# Patient Record
Sex: Male | Born: 1995 | Race: White | Hispanic: No | Marital: Single | Smoking: Current every day smoker
Health system: Southern US, Community
[De-identification: ages and names within clinical notes are randomized; demographics above are authoritative.]

---
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
## 2015-08-28 ENCOUNTER — Ambulatory Visit
Admission: EM | Admit: 2015-08-28 | Discharge: 2015-08-28 | Disposition: A | Discharge status: Home / Self Care | Payer: BLUE CROSS/BLUE SHIELD | Attending: Emergency Medicine | Admitting: Emergency Medicine

## 2015-08-28 DIAGNOSIS — Z0000 Encounter for general adult medical examination without abnormal findings: Secondary | ICD-10-CM

## 2015-08-28 DIAGNOSIS — Z113 Encounter for screening for infections with a predominantly sexual mode of transmission: Secondary | ICD-10-CM

## 2015-08-28 NOTE — Discharge Instructions (Signed)
Take the medication as written. Give us a working phone number so that we can contact you if needed. Refrain from sexual contact until you know your results and your partner(s) are treated if necessary. Follow-up with a primary care physician of your choice. See the list on your discharge papers.   Go to www.goodrx.com to look up your medications. This will give you a list of where you can find your prescriptions at the most affordable prices.

## 2015-08-28 NOTE — ED Provider Notes (Signed)
HPI  SUBJECTIVE:  Jeffrey Norris is a 20 y.o. male who presents for an STD check. He is asymptomatic. He denies testicular pain or swelling, penile discharge, genital rash, itching, abdominal pain, fevers, urinary symptoms. He is in a monogamous sexual relationship with a male, who is asymptomatic. They do not use condoms. STDs are not a concern today, he is just requesting screening. There are no aggravating or alleviating factors. He has not tried anything for this. Past medical history negative for gonorrhea, chlamydia, herpes, HIV, syphilis, Trichomonas, yeast, diabetes. PMD: None.  History reviewed. No pertinent past medical history.  History reviewed. No pertinent past surgical history.  History reviewed. No pertinent family history.  Social History  Substance Use Topics  . Smoking status: Current Every Day Smoker -- 0.50 packs/day    Types: Cigarettes  . Smokeless tobacco: None  . Alcohol Use: Yes     Comment: occ    No current facility-administered medications for this encounter. No current outpatient prescriptions on file.  No Known Allergies   ROS  As noted in HPI.   Physical Exam  BP 121/56 mmHg  Pulse 70  Temp(Src) 97.5 F (36.4 C) (Oral)  Resp 16  SpO2 100%  Constitutional: Well developed, well nourished, no acute distress Eyes:  EOMI, conjunctiva normal bilaterally HENT: Normocephalic, atraumatic,mucus membranes moist Respiratory: Normal inspiratory effort Cardiovascular: Normal rate GI: nondistended soft, nontender. No suprapubic tenderness  back: No CVA tenderness GU: Normal circumcised male, testes nontender descended equal bilaterally. No swelling, erythema. No tenderness over the epididymis. No penile rash, discharge. Patient declined chaperone.  Lymph: No inguinal lymphadenopathy. skin: No rash, skin intact Musculoskeletal: no deformities Neurologic: Alert & oriented x 3, no focal neuro deficits Psychiatric: Speech and behavior  appropriate   ED Course   Medications - No data to display  Orders Placed This Encounter  Procedures  . Chlamydia/NGC rt PCR    Standing Status: Standing     Number of Occurrences: 1     Standing Expiration Date:   . Urinalysis complete, with microscopic    Standing Status: Standing     Number of Occurrences: 1     Standing Expiration Date:   . HIV antibody    Standing Status: Standing     Number of Occurrences: 1     Standing Expiration Date:   . RPR    Standing Status: Standing     Number of Occurrences: 1     Standing Expiration Date:     No results found for this or any previous visit (from the past 24 hour(s)). No results found.    ED Clinical Impression  Screening for STD (sexually transmitted disease)  Normal physical exam   ED Assessment/Plan  Urine negative for trichomoniasis per lab.   Patient is asymptomatic. We'll not treat today. Sent off gonorrhea, chlamydia. Doing urine micro to rule out trichomonas. HIV, RPR pending. Advised that these results will be back in several days.. Advised pt to refrain from sexual contact until he knows lab results,  and partner(s) are treated if necessary. Pt provided working phone number. Pt agrees with plan.    *This clinic note was created using Dragon dictation software. Therefore, there may be occasional mistakes despite careful proofreading.  ?    Domenick GongAshley Christan Ciccarelli, MD 08/28/15 1505

## 2015-08-28 NOTE — ED Notes (Signed)
Here STD check.  SO wanted pt to have check up.

## 2015-08-28 NOTE — ED Notes (Signed)
Non-toxic appearing pt.  Ambulatory to treatment room. In NAD.

## 2015-08-29 LAB — CHLAMYDIA/NGC RT PCR (ARMC ONLY)
CHLAMYDIA TR: NOT DETECTED
N gonorrhoeae: NOT DETECTED

## 2015-08-29 LAB — HIV ANTIBODY (ROUTINE TESTING W REFLEX): HIV SCREEN 4TH GENERATION: NONREACTIVE

## 2015-08-29 LAB — RPR: RPR: NONREACTIVE

## 2016-12-03 ENCOUNTER — Encounter: Payer: Self-pay | Admitting: *Deleted

## 2016-12-03 ENCOUNTER — Ambulatory Visit
Admission: EM | Admit: 2016-12-03 | Discharge: 2016-12-03 | Disposition: A | Discharge status: Home / Self Care | Payer: BLUE CROSS/BLUE SHIELD | Attending: Emergency Medicine | Admitting: Emergency Medicine

## 2016-12-03 DIAGNOSIS — R21 Rash and other nonspecific skin eruption: Secondary | ICD-10-CM

## 2016-12-03 DIAGNOSIS — Z113 Encounter for screening for infections with a predominantly sexual mode of transmission: Secondary | ICD-10-CM

## 2016-12-03 DIAGNOSIS — L739 Follicular disorder, unspecified: Secondary | ICD-10-CM | POA: Diagnosis not present

## 2016-12-03 DIAGNOSIS — N489 Disorder of penis, unspecified: Secondary | ICD-10-CM

## 2016-12-03 DIAGNOSIS — Z202 Contact with and (suspected) exposure to infections with a predominantly sexual mode of transmission: Secondary | ICD-10-CM | POA: Diagnosis not present

## 2016-12-03 DIAGNOSIS — N509 Disorder of male genital organs, unspecified: Secondary | ICD-10-CM

## 2016-12-03 LAB — CHLAMYDIA/NGC RT PCR (ARMC ONLY)
CHLAMYDIA TR: NOT DETECTED
N GONORRHOEAE: NOT DETECTED

## 2016-12-03 MED ORDER — MUPIROCIN 2 % EX OINT: TOPICAL_OINTMENT | CUTANEOUS | 0 refills | Status: DC

## 2016-12-03 NOTE — ED Triage Notes (Signed)
Rash to genitalia, x "several weeks". Requesting STD check.

## 2016-12-03 NOTE — Discharge Instructions (Signed)
Use medication as prescribed.  Avoid shaving or trimming hair at this time. Practice safe sex. No sexual activity until results, symptoms resolved and follow up.   Follow up with your primary care physician or Health Dept tin one week. Return to Urgent care for new or worsening concerns.

## 2016-12-03 NOTE — ED Provider Notes (Signed)
[facility] ____________________________________________  Time seen: Approximately 11:10 AM  I have reviewed the triage vital signs and the nursing notes.   HISTORY  Chief Complaint Rash and Exposure to STD   HPI Jeffrey Norris is a 21 y.o. male  presenting for evaluation of rash to the groin area that is been present for approximately 2 weeks. Patient reports he has a few bumps to the shaft of his penis that he noticed last week. States occasionally a slight itching, no pain or burning with associated rash. Denies any drainage. Patient reports otherwise feels well. Denies any urinary frequency, urinary urgency or burning with urination. Denies any mass, swelling, penile drainage, penile or testicular pain or tenderness. Denies any other rash or skin changes. Denies history of similar in past. Reports last sexual encounter was approximately 3 weeks ago with a new partner which he intermittently uses condoms. Patient does express some concern of STDs. Reports sexual partner was asymptomatic. Denies any previous history of STDs. Denies previous herpes or cold sores. Patient rate states the rash is not painful. Reports rash is not draining. States he has also noticed to see a few areas in the hair above his penis. Patient reports that he does shave and trim hair is growing frequently.  Denies chest pain, shortness of breath, abdominal pain, back pain, dysuria, extremity pain, extremity swelling. Denies recent sickness. Denies recent antibiotic use.    History reviewed. No pertinent past medical history. Denies  There are no active problems to display for this patient.   History reviewed. No pertinent surgical history.   No current facility-administered medications for this encounter.   Current Outpatient Prescriptions:  .  mupirocin ointment (BACTROBAN) 2 %, Apply three times a day for 7 days., Disp: 22 g, Rfl: 0  Allergies Patient has no known allergies.  History  reviewed. No pertinent family history.  Social History Social History  Substance Use Topics  . Smoking status: Current Every Day Smoker    Packs/day: 0.50    Types: Cigarettes  . Smokeless tobacco: Never Used  . Alcohol use Yes     Comment: occ    Review of Systems Constitutional: No fever/chills ENT: No sore throat. Cardiovascular: Denies chest pain. Respiratory: Denies shortness of breath. Gastrointestinal: No abdominal pain.   Genitourinary: Negative for dysuria. Musculoskeletal: Negative for back pain. Skin: as above.   ____________________________________________   PHYSICAL EXAM:  VITAL SIGNS: ED Triage Vitals  Enc Vitals Group     BP 12/03/16 1009 132/73     Pulse Rate 12/03/16 1009 85     Resp 12/03/16 1009 16     Temp 12/03/16 1009 97.9 F (36.6 C)     Temp Source 12/03/16 1009 Oral     SpO2 12/03/16 1009 100 %     Weight 12/03/16 1011 150 lb (68 kg)     Height 12/03/16 1011 5\' 11"  (1.803 m)     Head Circumference --      Peak Flow --      Pain Score --      Pain Loc --      Pain Edu? --      Excl. in GC? --     Constitutional: Alert and oriented. Well appearing and in no acute distress.      Mouth/Throat: Mucous membranes are moist.Oropharynx non-erythematous. Cardiovascular: Normal rate, regular rhythm. Grossly normal heart sounds.  Good peripheral circulation. Respiratory: Normal respiratory effort without tachypnea nor retractions. Breath sounds are clear and equal bilaterally.  No wheezes, rales, rhonchi. Gastrointestinal: Soft and nontender.  No CVA tenderness. Penile: Exam completed with Selena Batten RN at bedside a chaperone. No mass or bulge. No penile or testicular tenderness to palpation, no discharge, no erythema, no vesicular appearing rash, four less than 0.25 cm small pustular lesions with slight erythematous base present scattered to dorsal shaft of penis and 2-3 lesions present and suprapubic hair without surrounding erythema, nontender, no  drainage, nonfluctuant. No other skin abnormalities noted. Musculoskeletal:  No midline cervical, thoracic or lumbar tenderness to palpation. Neurologic:  Normal speech and language. Speech is normal. No gait instability.  Skin:  Skin is warm, dry Psychiatric: Mood and affect are normal. Speech and behavior are normal. Patient exhibits appropriate insight and judgment   ___________________________________________   LABS (all labs ordered are listed, but only abnormal results are displayed)  Labs Reviewed  CHLAMYDIA/NGC RT PCR ([facility] ONLY)  HSV CULTURE AND TYPING  HIV ANTIBODY (ROUTINE TESTING)  HSV(HERPES SIMPLEX VRS) I + II AB-IGG  HSV(HERPES SIMPLEX VRS) I + II AB-IGM  RPR  MISC [facility] TEST (SEND OUT)    PROCEDURES Procedures     INITIAL IMPRESSION / ASSESSMENT AND PLAN / ED COURSE  Pertinent labs & imaging results that were available during my care of the patient were reviewed by me and considered in my medical decision making (see chart for details).  Very well-appearing patient. No acute distress. Presenting for evaluation of bumps to penis. Clinical appearance appears to be similar to a very mild folliculitis, and discussed with patient as he does frequently shaving trim hair as to avoid this and to avoid skin irritation. Discussed with patient we will prescribe topical Bactroban. HSV swab obtained same area. Discussed with patient and patient requests testing for HSV, RPR, HIV, gonorrhea, chlamydia and trichomonas. Encourage safe sex practices. Encouraged supportive care. Discussed no sexual intercourse until testing received, symptoms resolved and patient had follow-up. Information also given for [facility].Discussed indication, risks and benefits of medications with patient.  Discussed follow up with Primary care physician this week. Discussed follow up and return parameters including no resolution or any worsening concerns. Patient verbalized  understanding and agreed to plan.   ____________________________________________   FINAL CLINICAL IMPRESSION(S) / ED DIAGNOSES  Final diagnoses:  Screening for STD (sexually transmitted disease)  Penile lesion  Folliculitis     Discharge Medication List as of 12/03/2016 10:28 AM    START taking these medications   Details  mupirocin ointment (BACTROBAN) 2 % Apply three times a day for 7 days., Normal        Note: This dictation was prepared with Dragon dictation along with smaller phrase technology. Any transcriptional errors that result from this process are unintentional.         Renford Dills, NP 12/03/16 1150

## 2016-12-04 LAB — HSV(HERPES SIMPLEX VRS) I + II AB-IGG
HSV 1 Glycoprotein G Ab, IgG: <0.91 index (ref 0.00–0.90)
HSV 2 Glycoprotein G Ab, IgG: <0.91 index (ref 0.00–0.90)

## 2016-12-04 LAB — MISC LABCORP TEST (SEND OUT): Labcorp test code: 188052

## 2016-12-04 LAB — HSV(HERPES SIMPLEX VRS) I + II AB-IGM: HSVI/II Comb IgM: <0.91 Ratio (ref 0.00–0.90)

## 2016-12-04 LAB — RPR: RPR: NONREACTIVE

## 2016-12-04 LAB — HIV ANTIBODY (ROUTINE TESTING W REFLEX): HIV SCREEN 4TH GENERATION: NONREACTIVE

## 2016-12-05 LAB — HSV CULTURE AND TYPING

## 2019-04-25 ENCOUNTER — Encounter: Payer: Self-pay | Admitting: Emergency Medicine

## 2019-04-25 ENCOUNTER — Other Ambulatory Visit: Payer: Self-pay

## 2019-04-25 ENCOUNTER — Ambulatory Visit (INDEPENDENT_AMBULATORY_CARE_PROVIDER_SITE_OTHER): Payer: BC Managed Care – PPO

## 2019-04-25 ENCOUNTER — Ambulatory Visit
Admission: EM | Admit: 2019-04-25 | Discharge: 2019-04-25 | Disposition: A | Discharge status: Home / Self Care | Payer: BC Managed Care – PPO | Attending: Emergency Medicine | Admitting: Emergency Medicine

## 2019-04-25 DIAGNOSIS — M79661 Pain in right lower leg: Secondary | ICD-10-CM | POA: Diagnosis not present

## 2019-04-25 DIAGNOSIS — W1842XA Slipping, tripping and stumbling without falling due to stepping into hole or opening, initial encounter: Secondary | ICD-10-CM | POA: Diagnosis not present

## 2019-04-25 DIAGNOSIS — M79604 Pain in right leg: Secondary | ICD-10-CM | POA: Diagnosis not present

## 2019-04-25 DIAGNOSIS — M898X6 Other specified disorders of bone, lower leg: Secondary | ICD-10-CM

## 2019-04-25 NOTE — ED Triage Notes (Signed)
Patient states that he was walking and stepped in a hole and might have twisted his right foot.  Patient c/o pain in his right foot and ankle.

## 2019-04-25 NOTE — Discharge Instructions (Addendum)
The patient is advised to apply ice or cold packs intermittently as needed to relieve pain.  Elevate your foot/ leg higher than your heart as necessary to control pain and swelling.  Not improving in a week or 2 - follow-up with orthopedics

## 2019-04-25 NOTE — ED Provider Notes (Signed)
[facility]    CSN: [id number] Arrival date & time: 04/25/19  1128      History   Chief Complaint Chief Complaint  Patient presents with  . Foot Pain    right    HPI Jeffrey Norris is a 23 y.o. male.   HPI  23 year old male states that last night he was walking and stepped into a hole may be twisted on a rock but thinks he twisted into inversion.  He has pain over his distal right fibula.  Swelling present but no ecchymosis present.  States this morning he had a lot of pain when he first stepped on it.  He is having trouble bearing weight at the present time.       History reviewed. No pertinent past medical history.  There are no active problems to display for this patient.   History reviewed. No pertinent surgical history.     Home Medications    Prior to Admission medications   Not on File    Family History Family History  Problem Relation Age of Onset  . Healthy Mother   . Healthy Father     Social History Social History   Tobacco Use  . Smoking status: Current Every Day Smoker    Packs/day: 0.50    Types: Cigarettes  . Smokeless tobacco: Never Used  Substance Use Topics  . Alcohol use: Yes    Comment: occ  . Drug use: Yes    Types: Marijuana, Cocaine     Allergies   Patient has no known allergies.   Review of Systems Review of Systems  Constitutional: Positive for activity change. Negative for appetite change, chills, fatigue and fever.  Musculoskeletal: Positive for arthralgias and gait problem.  Skin: Negative for color change.  All other systems reviewed and are negative.    Physical Exam Triage Vital Signs ED Triage Vitals  Enc Vitals Group     BP 04/25/19 1141 127/77     Pulse Rate 04/25/19 1141 79     Resp 04/25/19 1141 16     Temp 04/25/19 1141 98.1 F (36.7 C)     Temp Source 04/25/19 1141 Oral     SpO2 04/25/19 1141 98 %     Weight 04/25/19 1138 173 lb (78.5 kg)     Height 04/25/19 1138 5\' 10"   (1.778 m)     Head Circumference --      Peak Flow --      Pain Score 04/25/19 1138 7     Pain Loc --      Pain Edu? --      Excl. in GC? --    No data found.  Updated Vital Signs BP 127/77 (BP Location: Left Arm)   Pulse 79   Temp 98.1 F (36.7 C) (Oral)   Resp 16   Ht 5\' 10"  (1.778 m)   Wt 173 lb (78.5 kg)   SpO2 98%   BMI 24.82 kg/m   Visual Acuity Right Eye Distance:   Left Eye Distance:   Bilateral Distance:    Right Eye Near:   Left Eye Near:    Bilateral Near:     Physical Exam Vitals signs and nursing note reviewed.  Constitutional:      General: He is not in acute distress.    Appearance: Normal appearance. He is normal weight. He is not ill-appearing, toxic-appearing or diaphoretic.  HENT:     Head: Normocephalic and atraumatic.  Eyes:  Conjunctiva/sclera: Conjunctivae normal.  Neck:     Musculoskeletal: Normal range of motion and neck supple.  Cardiovascular:     Rate and Rhythm: Normal rate and regular rhythm.     Heart sounds: Normal heart sounds.  Pulmonary:     Effort: Pulmonary effort is normal.     Breath sounds: Normal breath sounds.  Musculoskeletal:        General: Swelling, tenderness and signs of injury present.     Comments: Examination of the right lower extremity shows swelling over the lateral malleolus.  There is no ecchymosis or erythema present.  Patient has good dorsiflexion and plantarflexion.  He has a normal subtalar motion.  Compression of the distal tibia-fibula shows marked discomfort over the fibula at the juncture of the middle and distal thirds.  This continues to have tenderness all the way to the malleolus itself.  There is no tenderness of the anterior ankle or inferior malleolus.  He has no tenderness of the fifth metatarsal.  There is no tenderness of the calcaneus.  Medial foot is also benign.  Skin:    General: Skin is warm and dry.  Neurological:     General: No focal deficit present.     Mental Status: He is  alert and oriented to person, place, and time.  Psychiatric:        Mood and Affect: Mood normal.        Behavior: Behavior normal.        Thought Content: Thought content normal.        Judgment: Judgment normal.      UC Treatments / Results  Labs (all labs ordered are listed, but only abnormal results are displayed) Labs Reviewed - No data to display  EKG   Radiology Dg Tibia/fibula Right  Result Date: 04/25/2019 CLINICAL DATA:  Twisting injury, distal fibular pain EXAM: RIGHT TIBIA AND FIBULA - 2 VIEW COMPARISON:  None. FINDINGS: No fracture or dislocation of the right tibia or fibula. The partially included knee and ankle are unremarkable. Mild soft tissue edema overlying the lateral malleolus. IMPRESSION: 1. No fracture or dislocation of the right tibia or fibula. 2.  Mild soft tissue edema overlying the lateral malleolus. Electronically Signed   By: Lauralyn Primes M.D.   On: 04/25/2019 12:35    Procedures Procedures (including critical care time)  Medications Ordered in UC Medications - No data to display  Initial Impression / Assessment and Plan / UC Course  I have reviewed the triage vital signs and the nursing notes.  Pertinent labs & imaging results that were available during my care of the patient were reviewed by me and considered in my medical decision making (see chart for details).   Xrays reviewed with the patient. No fracture seen. Patient will elevate as necessary to control ply ice 20 minutes every 2 hours 4-5 times daily for pain control.  Patient states he has a boot at home which she can utilize to help assist with ambulation.  Is given a note for work for tomorrow.  If he continues to have pain in a week or 2 he will follow up with orthopedics.   Final Clinical Impressions(s) / UC Diagnoses   Final diagnoses:  Pain of right fibula     Discharge Instructions     The patient is advised to apply ice or cold packs intermittently as needed to relieve  pain.  Elevate your foot/ leg higher than your heart as necessary to control pain and swelling.  Not improving in a week or 2 - follow-up with orthopedics     ED Prescriptions    None     PDMP not reviewed this encounter.   Lorin Picket, PA-C 04/25/19 1311

## 2019-04-25 NOTE — ED Notes (Signed)
Patient states that he does not want the boot that he has one at home he can wear.  Tamala Ser, PA was notified.

## 2021-01-29 ENCOUNTER — Ambulatory Visit (INDEPENDENT_AMBULATORY_CARE_PROVIDER_SITE_OTHER): Payer: BLUE CROSS/BLUE SHIELD

## 2021-01-29 ENCOUNTER — Other Ambulatory Visit: Payer: Self-pay

## 2021-01-29 ENCOUNTER — Ambulatory Visit
Admission: EM | Admit: 2021-01-29 | Discharge: 2021-01-29 | Disposition: A | Discharge status: Home / Self Care | Payer: BLUE CROSS/BLUE SHIELD | Attending: Emergency Medicine | Admitting: Emergency Medicine

## 2021-01-29 DIAGNOSIS — K649 Unspecified hemorrhoids: Secondary | ICD-10-CM | POA: Diagnosis not present

## 2021-01-29 DIAGNOSIS — S92344A Nondisplaced fracture of fourth metatarsal bone, right foot, initial encounter for closed fracture: Secondary | ICD-10-CM | POA: Diagnosis not present

## 2021-01-29 DIAGNOSIS — S92334A Nondisplaced fracture of third metatarsal bone, right foot, initial encounter for closed fracture: Secondary | ICD-10-CM | POA: Diagnosis not present

## 2021-01-29 DIAGNOSIS — M79671 Pain in right foot: Secondary | ICD-10-CM | POA: Diagnosis not present

## 2021-01-29 MED ORDER — HYDROCORTISONE ACETATE 25 MG RE SUPP: 25.0000 mg | Freq: Two times a day (BID) | RECTAL | 0 refills | Status: AC

## 2021-01-29 NOTE — Discharge Instructions (Addendum)
As you stated, you have a boot and crutches at home.  Wear the boot and crutches to protect your foot from further injury.  I want you to have limited weightbearing until you have been evaluated by podiatry.  I have made a referral to [facility] foot [facility] in [place], they will be reaching out to you to schedule an appointment.  Use over-the-counter Tylenol and ibuprofen according to the package instructions as needed for pain.  For your hemorrhoids use hydrocortisone suppositories twice daily as needed for pain, bleeding, and itching.  If you have any increase in your rectal bleeding, began passing clots, start running fever, or other concerns please go to the ER for evaluation.  Increase your oral fiber intake so that she increase the bulk to her stool and decrease constipation.  Increase your oral water intake for the above reasons as well.  Shoot for goal of a gallon a day.  You can also use over-the-counter stool softeners, such as Colace, twice daily to prevent constipation and straining.
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
## 2022-11-12 ENCOUNTER — Ambulatory Visit
Admission: EM | Admit: 2022-11-12 | Discharge: 2022-11-12 | Disposition: A | Discharge status: Home / Self Care | Payer: Self-pay | Attending: Emergency Medicine | Admitting: Emergency Medicine

## 2022-11-12 ENCOUNTER — Ambulatory Visit (INDEPENDENT_AMBULATORY_CARE_PROVIDER_SITE_OTHER): Payer: Self-pay

## 2022-11-12 DIAGNOSIS — R030 Elevated blood-pressure reading, without diagnosis of hypertension: Secondary | ICD-10-CM

## 2022-11-12 DIAGNOSIS — R1030 Lower abdominal pain, unspecified: Secondary | ICD-10-CM

## 2022-11-12 DIAGNOSIS — K5900 Constipation, unspecified: Secondary | ICD-10-CM

## 2022-11-12 DIAGNOSIS — Z720 Tobacco use: Secondary | ICD-10-CM

## 2022-11-12 NOTE — ED Triage Notes (Signed)
Pt reports abd pain starting 6 days ago, intermittent. Slightly improved but continued lower abd pain. Is now able to hold stuff down. Has not had a normal BM in 1 week. Has tried laxatives three times with no relief.

## 2022-11-12 NOTE — ED Provider Notes (Signed)
[facility]    CSN: [id number] Arrival date & time: 11/12/22  1314      History   Chief Complaint Chief Complaint  Patient presents with   Abdominal Pain    HPI Jeffrey Norris is a 27 y.o. male.   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  Patient states he was having some problems with vomiting and constipation, took a laxative and had a bowel movement, but still feels like he is having some constipation issues.  Patient endorses vaping and alcohol use(beer every other day)  The history is provided by the patient. No language interpreter was used.    History reviewed. No pertinent past medical history.  Patient Active Problem List   Diagnosis Date Noted   Lower abdominal pain 11/12/2022   Elevated blood pressure reading 11/12/2022   Constipation 11/12/2022   Vapes nicotine containing substance 11/12/2022    No past surgical history on file.     Home Medications    Prior to Admission medications   Medication Sig Start Date End Date Taking? Authorizing Provider  hydrocortisone (ANUSOL-HC) 25 MG suppository Place 1 suppository (25 mg total) rectally 2 (two) times daily. 01/29/21   Becky Augusta, NP    Family History Family History  Problem Relation Age of Onset   Healthy Mother    Healthy Father     Social History Social History   Tobacco Use   Smoking status: Every Day    Packs/day: .5    Types: Cigarettes   Smokeless tobacco: Never  Vaping Use   Vaping Use: Never used  Substance Use Topics   Alcohol use: Yes    Comment: daily   Drug use: Yes    Frequency: 7.0 times per week    Types: Marijuana, Cocaine     Allergies   Patient has no known allergies.   Review of Systems Review of Systems  Constitutional:  Negative for fever.  Gastrointestinal:  Positive for abdominal pain, constipation, nausea and vomiting.  Genitourinary:  Negative for  dysuria and flank pain.  Musculoskeletal:  Negative for back pain.  All other systems reviewed and are negative.    Physical Exam Triage Vital Signs ED Triage Vitals  Enc Vitals Group     BP 11/12/22 1446 (!) 131/100     Pulse Rate 11/12/22 1445 (!) 59     Resp 11/12/22 1445 18     Temp 11/12/22 1445 98.2 F (36.8 C)     Temp Source 11/12/22 1445 Oral     SpO2 11/12/22 1445 100 %     Weight --      Height --      Head Circumference --      Peak Flow --      Pain Score 11/12/22 1441 5     Pain Loc --      Pain Edu? --      Excl. in GC? --    No data found.  Updated Vital Signs BP (!) 131/100 (BP Location: Left Arm)   Pulse (!) 59   Temp 98.2 F (36.8 C) (Oral)   Resp 18   SpO2 100%   Visual Acuity Right Eye Distance:   Left Eye Distance:   Bilateral Distance:    Right Eye Near:   Left Eye Near:    Bilateral Near:     Physical Exam Vitals and nursing note reviewed.  Constitutional:  General: He is not in acute distress.    Appearance: He is well-developed.  HENT:     Head: Normocephalic and atraumatic.  Eyes:     Conjunctiva/sclera: Conjunctivae normal.  Cardiovascular:     Rate and Rhythm: Normal rate and regular rhythm.     Heart sounds: No murmur heard. Pulmonary:     Effort: Pulmonary effort is normal. No respiratory distress.     Breath sounds: Normal breath sounds.  Abdominal:     General: Bowel sounds are normal.     Palpations: Abdomen is soft.     Tenderness: There is generalized abdominal tenderness.  Musculoskeletal:        General: No swelling.     Cervical back: Neck supple.  Skin:    General: Skin is warm and dry.     Capillary Refill: Capillary refill takes less than 2 seconds.  Neurological:     General: No focal deficit present.     Mental Status: He is alert and oriented to person, place, and time.  Psychiatric:        Mood and Affect: Mood normal.      UC Treatments / Results  Labs (all labs ordered are listed, but  only abnormal results are displayed) Labs Reviewed - No data to display  EKG   Radiology DG Abd Acute W/Chest  Result Date: 11/12/2022 CLINICAL DATA:  Constipation EXAM: DG ABDOMEN ACUTE WITH 1 VIEW CHEST COMPARISON:  None Available. FINDINGS: There is no evidence of dilated bowel loops or free intraperitoneal air. There is moderate stool burden. No suspicious radiopaque calculi. There are phleboliths in the pelvis. Heart size and mediastinal contours are within normal limits. Both lungs are clear. IMPRESSION: Negative abdominal radiographs.  No acute cardiopulmonary disease. Electronically Signed   By: Darliss Cheney M.D.   On: 11/12/2022 16:16    Procedures Procedures (including critical care time)  Medications Ordered in UC Medications - No data to display  Initial Impression / Assessment and Plan / UC Course  I have reviewed the triage vital signs and the nursing notes.  Pertinent labs & imaging results that were available during my care of the patient were reviewed by me and considered in my medical decision making (see chart for details).     Ddx: Abdominal pain, constipation, viral illness Final Clinical Impressions(s) / UC Diagnoses   Final diagnoses:  Lower abdominal pain  Constipation, unspecified constipation type  Elevated blood pressure reading  Vapes nicotine containing substance     Discharge Instructions      Drink plenty of water, may use MiraLAX daily as label directed.  Avoid spicy greasy fried foods , avoid caffeine, avoid vaping . Follow-up with PCP.  Return to urgent care as needed.  If you have worsening abdominal pain, fever, unable to keep food or fluids down go to the ER for further evaluation, your x-ray was negative today for acute findings.    ED Prescriptions   None    PDMP not reviewed this encounter.   Clancy Gourd, NP 11/12/22 2137

## 2022-11-12 NOTE — Discharge Instructions (Addendum)
Drink plenty of water, may use MiraLAX daily as label directed.  Avoid spicy greasy fried foods , avoid caffeine, avoid vaping . Follow-up with PCP.  Return to urgent care as needed.  If you have worsening abdominal pain, fever, unable to keep food or fluids down go to the ER for further evaluation, your x-ray was negative today for acute findings.
